# Patient Record
Sex: Female | Born: 1990
Health system: Southern US, Community
[De-identification: ages and names within clinical notes are randomized; demographics above are authoritative.]

## PROBLEM LIST (undated history)

## (undated) DIAGNOSIS — E119 Type 2 diabetes mellitus without complications: Secondary | ICD-10-CM

## (undated) HISTORY — PX: CHOLECYSTECTOMY, LAPAROSCOPIC: SHX56

## (undated) HISTORY — PX: WISDOM TOOTH EXTRACTION: SHX21

---
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
## 2011-02-12 ENCOUNTER — Emergency Department: Payer: Self-pay | Admitting: Emergency Medicine

## 2011-10-02 ENCOUNTER — Ambulatory Visit: Payer: Self-pay

## 2012-01-05 IMAGING — US ULTRASOUND RIGHT BREAST
1 series · 7 of 7 positions shown · non-contrast
Comparison: none

REASON FOR EXAM: [DATE]  smooth firm mobile nodule at [DATE] right breast 2 cm
from the areola
COMMENTS:

[Series 1: ultrasound right breast · 7 of 7 slices shown]
[im 1/7]
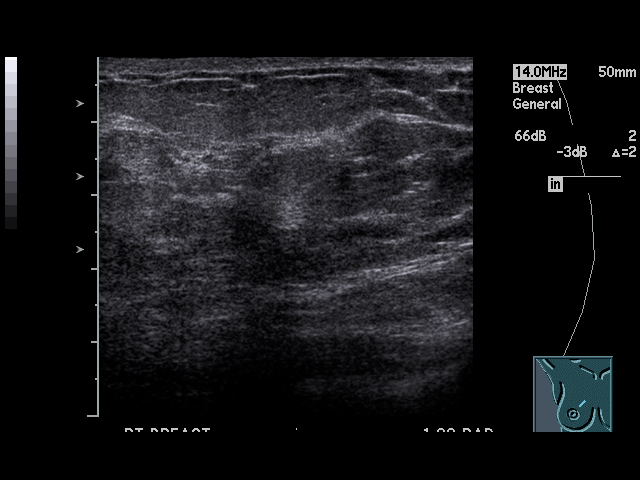
[im 2/7]
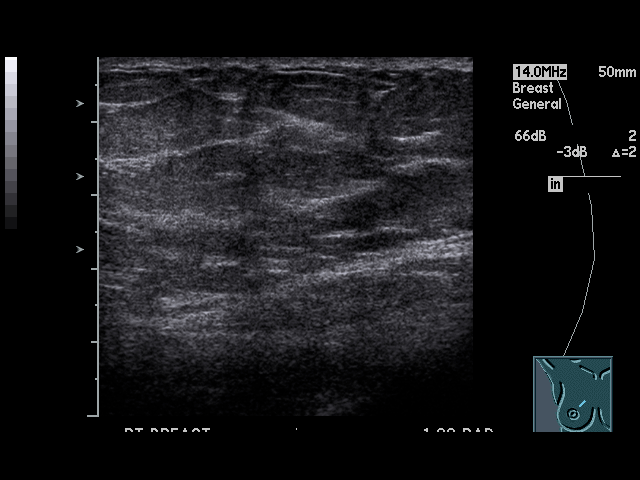
[im 3/7]
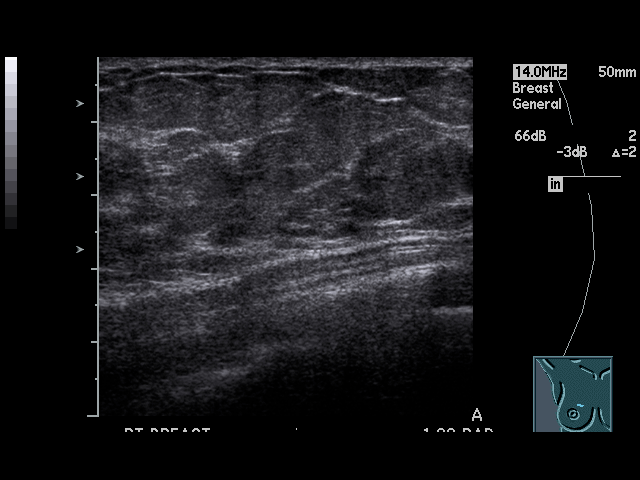
[im 4/7]
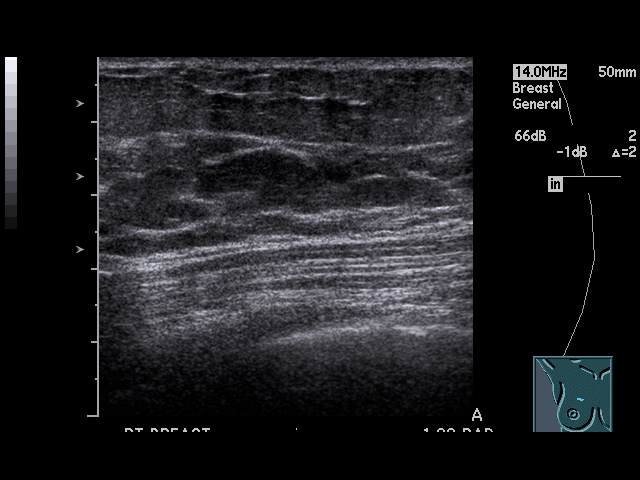
[im 5/7]
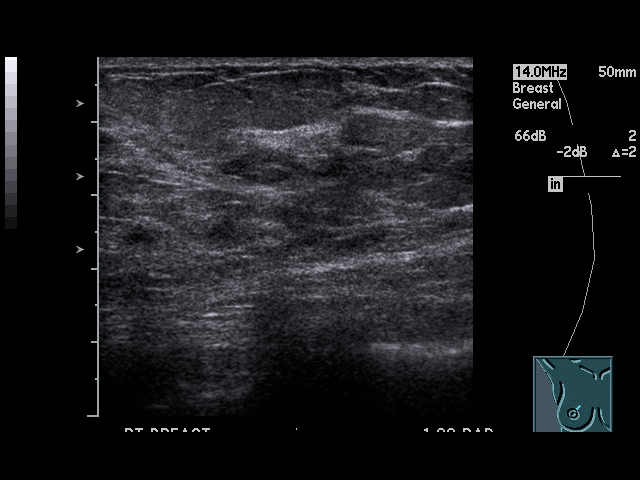
[im 6/7]
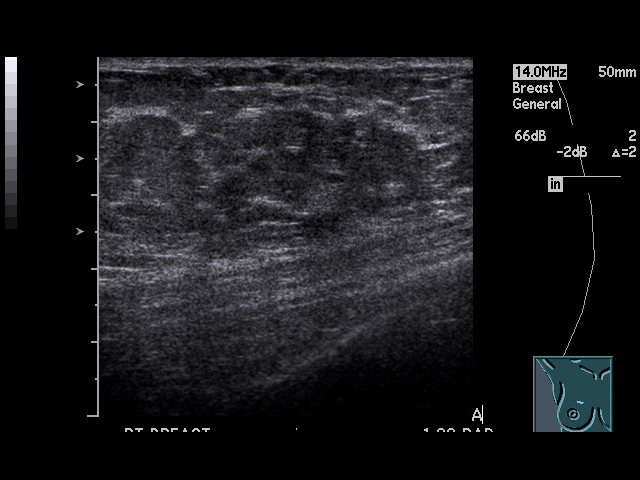
[im 7/7]
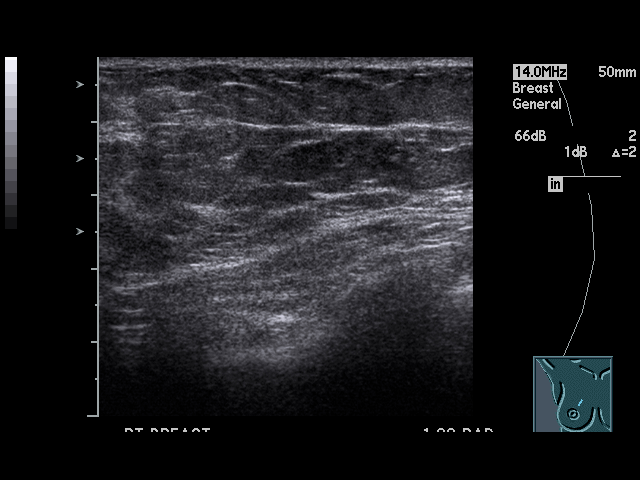

[7 of 7 positions shown; findings below may reference images not displayed]

PROCEDURE:     US  - US RT BREAST ([REDACTED])  - October 02, 2011 [DATE]

RESULT:     The patient has a palpable area of concern in the right breast
at approximately 1 o'clock. Ultrasound examination targeted to this region
shows no solid or cystic mass. No distortion of the breast architecture is
seen. No skin thickening is noted.
IMPRESSION: 1. Benign-appearing targeted right breast ultrasound.
2. Surgical consultation should not be deferred due to the lack of
sonographic abnormalities if a palpable area of concern persists or enlarges.

## 2012-06-15 ENCOUNTER — Emergency Department: Payer: Self-pay | Admitting: *Deleted

## 2012-06-16 LAB — URINALYSIS, COMPLETE
Ketone: NEGATIVE
Leukocyte Esterase: NEGATIVE
Nitrite: NEGATIVE
Ph: 6 (ref 4.5–8.0)
Protein: NEGATIVE
Squamous Epithelial: <1
WBC UR: 1 /HPF (ref 0–5)

## 2012-06-16 LAB — COMPREHENSIVE METABOLIC PANEL
Albumin: 3.9 g/dL (ref 3.4–5.0)
Alkaline Phosphatase: 95 U/L (ref 50–136)
BUN: 10 mg/dL (ref 7–18)
Chloride: 104 mmol/L (ref 98–107)
Co2: 23 mmol/L (ref 21–32)
Creatinine: 0.71 mg/dL (ref 0.60–1.30)
EGFR (African American): >60
EGFR (Non-African Amer.): >60
Glucose: 319 mg/dL — ABNORMAL HIGH (ref 65–99)
Osmolality: 281 (ref 275–301)
SGOT(AST): 31 U/L (ref 15–37)
SGPT (ALT): 41 U/L (ref 12–78)
Total Protein: 7.9 g/dL (ref 6.4–8.2)

## 2012-06-16 LAB — CBC
HGB: 12.7 g/dL (ref 12.0–16.0)
Platelet: 306 10*3/uL (ref 150–440)
RBC: 4.99 10*6/uL (ref 3.80–5.20)
RDW: 16.5 % — ABNORMAL HIGH (ref 11.5–14.5)
WBC: 10.5 10*3/uL (ref 3.6–11.0)

## 2015-08-12 ENCOUNTER — Encounter: Payer: Self-pay | Admitting: Emergency Medicine

## 2015-08-12 ENCOUNTER — Emergency Department
Admission: EM | Admit: 2015-08-12 | Discharge: 2015-08-12 | Disposition: A | Discharge status: Home / Self Care | Payer: BC Managed Care – PPO | Attending: Emergency Medicine | Admitting: Emergency Medicine

## 2015-08-12 DIAGNOSIS — Z794 Long term (current) use of insulin: Secondary | ICD-10-CM | POA: Insufficient documentation

## 2015-08-12 DIAGNOSIS — M5432 Sciatica, left side: Secondary | ICD-10-CM | POA: Insufficient documentation

## 2015-08-12 DIAGNOSIS — Z79899 Other long term (current) drug therapy: Secondary | ICD-10-CM | POA: Insufficient documentation

## 2015-08-12 DIAGNOSIS — E119 Type 2 diabetes mellitus without complications: Secondary | ICD-10-CM | POA: Insufficient documentation

## 2015-08-12 DIAGNOSIS — M79605 Pain in left leg: Secondary | ICD-10-CM | POA: Diagnosis present

## 2015-08-12 HISTORY — DX: Type 2 diabetes mellitus without complications: E11.9

## 2015-08-12 MED ORDER — MELOXICAM 15 MG PO TABS: 15.0000 mg | ORAL_TABLET | Freq: Every day | ORAL | Status: DC

## 2015-08-12 NOTE — ED Notes (Signed)
States she developed pain to left hip area  States pain started about 1 week ago. Unsure of injury but did hit left lower leg ..ambulates with sl limp .

## 2015-08-12 NOTE — ED Provider Notes (Signed)
[facility] Emergency Department Provider Note  ____________________________________________  Time seen: Approximately 7:17 AM  I have reviewed the triage vital signs and the nursing notes.   HISTORY  Chief Complaint Leg Pain    HPI [place] Tessa LernerGarcia Rattigan is a 24 y.o. female who presents to the emergency department complaining of left leg/hip pain times 4 days. She denies any any p injury precipitating this symptom. She states that the pain is described as a sharp/burning sensation. She states that it runs through her buttocks down to the lateral aspect of her left knee. She states that symptoms have increased over the past 4 days. She is not taken medication prior to arrival. She denies any previous incidents of same. She states that the pain is worse with sitting or with "no movement." The pain started as mild and is now moderate.   Past Medical History  Diagnosis Date  . Diabetes mellitus without complication (HCC)     There are no active problems to display for this patient.   History reviewed. No pertinent past surgical history.  Current Outpatient Rx  Name  Route  Sig  Dispense  Refill  . insulin glargine (LANTUS) 100 UNIT/ML injection   Subcutaneous   Inject into the skin at bedtime.         . metFORMIN (GLUCOPHAGE) 500 MG tablet   Oral   Take 500 mg by mouth 2 (two) times daily with a meal.         . meloxicam (MOBIC) 15 MG tablet   Oral   Take 1 tablet (15 mg total) by mouth daily.   30 tablet   0     Allergies Review of patient's allergies indicates no known allergies.  No family history on file.  Social History Social History  Substance Use Topics  . Smoking status: Never Smoker   . Smokeless tobacco: None  . Alcohol Use: No    Review of Systems Constitutional: No fever/chills Eyes: No visual changes. ENT: No sore throat. Cardiovascular: Denies chest pain. Respiratory: Denies shortness of breath. Gastrointestinal:  No abdominal pain.  No nausea, no vomiting.  No diarrhea.  No constipation. Genitourinary: Negative for dysuria. Musculoskeletal: Negative for back pain. Endorses left hip/leg pain Skin: Negative for rash. Neurological: Negative for headaches, focal weakness or numbness.  10-point ROS otherwise negative.  ____________________________________________   PHYSICAL EXAM:  VITAL SIGNS: ED Triage Vitals  Enc Vitals Group     BP --      Pulse --      Resp --      Temp --      Temp src --      SpO2 --      Weight --      Height --      Head Cir --      Peak Flow --      Pain Score --      Pain Loc --      Pain Edu? --      Excl. in GC? --     Constitutional: Alert and oriented. Well appearing and in no acute distress. Eyes: Conjunctivae are normal. PERRL. EOMI. Head: Atraumatic. Nose: No congestion/rhinnorhea. Mouth/Throat: Mucous membranes are moist.  Oropharynx non-erythematous. Neck: No stridor.   Cardiovascular: Normal rate, regular rhythm. Grossly normal heart sounds.  Good peripheral circulation. Respiratory: Normal respiratory effort.  No retractions. Lungs CTAB. Gastrointestinal: Soft and nontender. No distention. No abdominal bruits. No CVA tenderness. Musculoskeletal: No lower extremity tenderness  nor edema.  No joint effusions. Full range of motion to left hip and left knee. No visible deformities. Patient has tenderness to palpation over the sciatic notch no other focal tenderness. No tenderness to palpation over the lumbar spine or paraspinal muscles. Equal sensation and strength bilateral lower extremities. Pulses intact distally. Positive straight leg raise left side. Neurologic:  Normal speech and language. No gross focal neurologic deficits are appreciated. No gait instability. Skin:  Skin is warm, dry and intact. No rash noted. Psychiatric: Mood and affect are normal. Speech and behavior are normal.  ____________________________________________   LABS (all  labs ordered are listed, but only abnormal results are displayed)  Labs Reviewed - No data to display ____________________________________________  EKG   ____________________________________________  RADIOLOGY   ____________________________________________   PROCEDURES  Procedure(s) performed: None  Critical Care performed: No  ____________________________________________   INITIAL IMPRESSION / ASSESSMENT AND PLAN / ED COURSE  Pertinent labs & imaging results that were available during my care of the patient were reviewed by me and considered in my medical decision making (see chart for details).  Patient's history, symptoms, physical exam are consistent with a diagnosis of sciatica. I advised the patient of findings and diagnosis and she verbalizes understanding of same. I'll place the patient on meloxicam for symptomatic relief and advised the patient of this. She verbalizes understanding. The patient is instructed not to take any anti-inflammatories other than the prescribed medication. She verbalizes understanding and compliance treatment plan. ____________________________________________   FINAL CLINICAL IMPRESSION(S) / ED DIAGNOSES  Final diagnoses:  Sciatica of left side      Racheal Patches, PA-C 08/12/15 0731  Loleta Rose, MD 08/12/15 1416

## 2015-08-12 NOTE — Discharge Instructions (Signed)
Radicular Pain °Radicular pain in either the arm or leg is usually from a bulging or herniated disk in the spine. A piece of the herniated disk may press against the nerves as the nerves exit the spine. This causes pain which is felt at the tips of the nerves down the arm or leg. Other causes of radicular pain may include: °· Fractures. °· Heart disease. °· Cancer. °· An abnormal and usually degenerative state of the nervous system or nerves (neuropathy). °Diagnosis may require CT or MRI scanning to determine the primary cause.  °Nerves that start at the neck (nerve roots) may cause radicular pain in the outer shoulder and arm. It can spread down to the thumb and fingers. The symptoms vary depending on which nerve root has been affected. In most cases radicular pain improves with conservative treatment. Neck problems may require physical therapy, a neck collar, or cervical traction. Treatment may take many weeks, and surgery may be considered if the symptoms do not improve.  °Conservative treatment is also recommended for sciatica. Sciatica causes pain to radiate from the lower back or buttock area down the leg into the foot. Often there is a history of back problems. Most patients with sciatica are better after 2 to 4 weeks of rest and other supportive care. Short term bed rest can reduce the disk pressure considerably. Sitting, however, is not a good position since this increases the pressure on the disk. You should avoid bending, lifting, and all other activities which make the problem worse. Traction can be used in severe cases. Surgery is usually reserved for patients who do not improve within the first months of treatment. °Only take over-the-counter or prescription medicines for pain, discomfort, or fever as directed by your caregiver. Narcotics and muscle relaxants may help by relieving more severe pain and spasm and by providing mild sedation. Cold or massage can give significant relief. Spinal manipulation  is not recommended. It can increase the degree of disc protrusion. Epidural steroid injections are often effective treatment for radicular pain. These injections deliver medicine to the spinal nerve in the space between the protective covering of the spinal cord and back bones (vertebrae). Your caregiver can give you more information about steroid injections. These injections are most effective when given within two weeks of the onset of pain.  °You should see your caregiver for follow up care as recommended. A program for neck and back injury rehabilitation with stretching and strengthening exercises is an important part of management.  °SEEK IMMEDIATE MEDICAL CARE IF: °· You develop increased pain, weakness, or numbness in your arm or leg. °· You develop difficulty with bladder or bowel control. °· You develop abdominal pain. °  °This information is not intended to replace advice given to you by your health care provider. Make sure you discuss any questions you have with your health care provider. °  °Document Released: 11/07/2004 Document Revised: 10/21/2014 Document Reviewed: 04/26/2015 °Elsevier Interactive Patient Education ©2016 [employer] ° °Sciatica °Sciatica is pain, weakness, numbness, or tingling along the path of the sciatic nerve. The nerve starts in the lower back and runs down the back of each leg. The nerve controls the muscles in the lower leg and in the back of the knee, while also providing sensation to the back of the thigh, lower leg, and the sole of your foot. Sciatica is a symptom of another medical condition. For instance, nerve damage or certain conditions, such as a herniated disk or bone spur on the   spine, pinch or put pressure on the sciatic nerve. This causes the pain, weakness, or other sensations normally associated with sciatica. Generally, sciatica only affects one side of the body. °CAUSES  °· Herniated or slipped disc. °· Degenerative disk disease. °· A pain disorder involving  the narrow muscle in the buttocks (piriformis syndrome). °· Pelvic injury or fracture. °· Pregnancy. °· Tumor (rare). °SYMPTOMS  °Symptoms can vary from mild to very severe. The symptoms usually travel from the low back to the buttocks and down the back of the leg. Symptoms can include: °· Mild tingling or dull aches in the lower back, leg, or hip. °· Numbness in the back of the calf or sole of the foot. °· Burning sensations in the lower back, leg, or hip. °· Sharp pains in the lower back, leg, or hip. °· Leg weakness. °· Severe back pain inhibiting movement. °These symptoms may get worse with coughing, sneezing, laughing, or prolonged sitting or standing. Also, being overweight may worsen symptoms. °DIAGNOSIS  °Your caregiver will perform a physical exam to look for common symptoms of sciatica. He or she may ask you to do certain movements or activities that would trigger sciatic nerve pain. Other tests may be performed to find the cause of the sciatica. These may include: °· Blood tests. °· X-rays. °· Imaging tests, such as an MRI or CT scan. °TREATMENT  °Treatment is directed at the cause of the sciatic pain. Sometimes, treatment is not necessary and the pain and discomfort goes away on its own. If treatment is needed, your caregiver may suggest: °· Over-the-counter medicines to relieve pain. °· Prescription medicines, such as anti-inflammatory medicine, muscle relaxants, or narcotics. °· Applying heat or ice to the painful area. °· Steroid injections to lessen pain, irritation, and inflammation around the nerve. °· Reducing activity during periods of pain. °· Exercising and stretching to strengthen your abdomen and improve flexibility of your spine. Your caregiver may suggest losing weight if the extra weight makes the back pain worse. °· Physical therapy. °· Surgery to eliminate what is pressing or pinching the nerve, such as a bone spur or part of a herniated disk. °HOME CARE INSTRUCTIONS  °· Only take  over-the-counter or prescription medicines for pain or discomfort as directed by your caregiver. °· Apply ice to the affected area for 20 minutes, 3-4 times a day for the first 48-72 hours. Then try heat in the same way. °· Exercise, stretch, or perform your usual activities if these do not aggravate your pain. °· Attend physical therapy sessions as directed by your caregiver. °· Keep all follow-up appointments as directed by your caregiver. °· Do not wear high heels or shoes that do not provide proper support. °· Check your mattress to see if it is too soft. A firm mattress may lessen your pain and discomfort. °SEEK IMMEDIATE MEDICAL CARE IF:  °· You lose control of your bowel or bladder (incontinence). °· You have increasing weakness in the lower back, pelvis, buttocks, or legs. °· You have redness or swelling of your back. °· You have a burning sensation when you urinate. °· You have pain that gets worse when you lie down or awakens you at night. °· Your pain is worse than you have experienced in the past. °· Your pain is lasting longer than 4 weeks. °· You are suddenly losing weight without reason. °MAKE SURE YOU: °· Understand these instructions. °· Will watch your condition. °· Will get help right away if you are not doing   well or get worse. °  °This information is not intended to replace advice given to you by your health care provider. Make sure you discuss any questions you have with your health care provider. °  °Document Released: 09/24/2001 Document Revised: 06/21/2015 Document Reviewed: 02/09/2012 °Elsevier Interactive Patient Education ©2016 [employer] ° °

## 2015-08-12 NOTE — ED Notes (Signed)
Presents with pain to left hip area for about 1 week

## 2018-07-13 ENCOUNTER — Other Ambulatory Visit: Payer: Self-pay | Admitting: Physician Assistant

## 2018-07-13 DIAGNOSIS — R1011 Right upper quadrant pain: Secondary | ICD-10-CM

## 2018-07-17 ENCOUNTER — Ambulatory Visit: Payer: BC Managed Care – PPO

## 2019-02-15 ENCOUNTER — Other Ambulatory Visit: Payer: Self-pay

## 2019-02-15 ENCOUNTER — Encounter: Payer: Self-pay | Admitting: Emergency Medicine

## 2019-02-15 DIAGNOSIS — E119 Type 2 diabetes mellitus without complications: Secondary | ICD-10-CM | POA: Insufficient documentation

## 2019-02-15 DIAGNOSIS — O9989 Other specified diseases and conditions complicating pregnancy, childbirth and the puerperium: Secondary | ICD-10-CM | POA: Insufficient documentation

## 2019-02-15 DIAGNOSIS — O210 Mild hyperemesis gravidarum: Secondary | ICD-10-CM | POA: Diagnosis present

## 2019-02-15 DIAGNOSIS — Z3A01 Less than 8 weeks gestation of pregnancy: Secondary | ICD-10-CM | POA: Insufficient documentation

## 2019-02-15 DIAGNOSIS — O99619 Diseases of the digestive system complicating pregnancy, unspecified trimester: Secondary | ICD-10-CM | POA: Insufficient documentation

## 2019-02-15 DIAGNOSIS — K8020 Calculus of gallbladder without cholecystitis without obstruction: Secondary | ICD-10-CM | POA: Insufficient documentation

## 2019-02-15 DIAGNOSIS — R1011 Right upper quadrant pain: Secondary | ICD-10-CM | POA: Diagnosis not present

## 2019-02-15 DIAGNOSIS — Z794 Long term (current) use of insulin: Secondary | ICD-10-CM | POA: Insufficient documentation

## 2019-02-15 DIAGNOSIS — O24111 Pre-existing diabetes mellitus, type 2, in pregnancy, first trimester: Secondary | ICD-10-CM | POA: Diagnosis not present

## 2019-02-15 NOTE — ED Triage Notes (Addendum)
Patient ambulatory to triage with steady gait, without difficulty or distress noted, mask in place; pt reports upper abd pain radiating around into back since yesterday, worse tonight accomp by N/V; pt reports being tested at work today (for loss of taste & smell) for COVID, results pending--denies cough/fever/congestion/SHOB

## 2019-02-16 ENCOUNTER — Emergency Department: Payer: BC Managed Care – PPO

## 2019-02-16 ENCOUNTER — Emergency Department
Admission: EM | Admit: 2019-02-16 | Discharge: 2019-02-16 | Disposition: A | Discharge status: Home / Self Care | Payer: BC Managed Care – PPO | Attending: Emergency Medicine | Admitting: Emergency Medicine

## 2019-02-16 DIAGNOSIS — O210 Mild hyperemesis gravidarum: Secondary | ICD-10-CM

## 2019-02-16 DIAGNOSIS — R1011 Right upper quadrant pain: Secondary | ICD-10-CM

## 2019-02-16 DIAGNOSIS — K8020 Calculus of gallbladder without cholecystitis without obstruction: Secondary | ICD-10-CM

## 2019-02-16 LAB — URINALYSIS, COMPLETE (UACMP) WITH MICROSCOPIC
Bacteria, UA: NONE SEEN
Bilirubin Urine: NEGATIVE
Glucose, UA: >=500 mg/dL — AB
Hgb urine dipstick: NEGATIVE
Ketones, ur: 80 mg/dL — AB
Leukocytes,Ua: NEGATIVE
Nitrite: POSITIVE — AB
Protein, ur: 30 mg/dL — AB
Specific Gravity, Urine: 1.039 — ABNORMAL HIGH (ref 1.005–1.030)
pH: 6 (ref 5.0–8.0)

## 2019-02-16 LAB — COMPREHENSIVE METABOLIC PANEL
ALT: 30 U/L (ref 0–44)
AST: 29 U/L (ref 15–41)
Albumin: 4.1 g/dL (ref 3.5–5.0)
Alkaline Phosphatase: 64 U/L (ref 38–126)
Anion gap: 13 (ref 5–15)
BUN: 6 mg/dL (ref 6–20)
CO2: 20 mmol/L — ABNORMAL LOW (ref 22–32)
Calcium: 8.6 mg/dL — ABNORMAL LOW (ref 8.9–10.3)
Chloride: 101 mmol/L (ref 98–111)
Creatinine, Ser: 0.47 mg/dL (ref 0.44–1.00)
GFR calc Af Amer: >60 mL/min (ref >60)
GFR calc non Af Amer: >60 mL/min (ref >60)
Glucose, Bld: 252 mg/dL — ABNORMAL HIGH (ref 70–99)
Potassium: 3.4 mmol/L — ABNORMAL LOW (ref 3.5–5.1)
Sodium: 134 mmol/L — ABNORMAL LOW (ref 135–145)
Total Bilirubin: 0.5 mg/dL (ref 0.3–1.2)
Total Protein: 7.9 g/dL (ref 6.5–8.1)

## 2019-02-16 LAB — CBC WITH DIFFERENTIAL/PLATELET
Abs Immature Granulocytes: 0.03 10*3/uL (ref 0.00–0.07)
Basophils Absolute: 0 10*3/uL (ref 0.0–0.1)
Basophils Relative: 1 %
Eosinophils Absolute: 0.1 10*3/uL (ref 0.0–0.5)
Eosinophils Relative: 1 %
HCT: 43.8 % (ref 36.0–46.0)
Hemoglobin: 14 g/dL (ref 12.0–15.0)
Immature Granulocytes: 0 %
Lymphocytes Relative: 34 %
Lymphs Abs: 2.6 10*3/uL (ref 0.7–4.0)
MCH: 25.3 pg — ABNORMAL LOW (ref 26.0–34.0)
MCHC: 32 g/dL (ref 30.0–36.0)
MCV: 79.2 fL — ABNORMAL LOW (ref 80.0–100.0)
Monocytes Absolute: 0.4 10*3/uL (ref 0.1–1.0)
Monocytes Relative: 5 %
Neutro Abs: 4.5 10*3/uL (ref 1.7–7.7)
Neutrophils Relative %: 59 %
Platelets: 296 10*3/uL (ref 150–400)
RBC: 5.53 MIL/uL — ABNORMAL HIGH (ref 3.87–5.11)
RDW: 14.5 % (ref 11.5–15.5)
WBC: 7.6 10*3/uL (ref 4.0–10.5)
nRBC: 0 % (ref 0.0–0.2)

## 2019-02-16 LAB — LIPASE, BLOOD: Lipase: 21 U/L (ref 11–51)

## 2019-02-16 LAB — HCG, QUANTITATIVE, PREGNANCY: hCG, Beta Chain, Quant, S: 6961 m[IU]/mL — ABNORMAL HIGH (ref <5)

## 2019-02-16 MED ORDER — SODIUM CHLORIDE 0.9 % IV BOLUS
1000.0000 mL | Freq: Once | INTRAVENOUS | Status: AC
  Administered 2019-02-16: 1000 mL via INTRAVENOUS

## 2019-02-16 MED ORDER — METOCLOPRAMIDE HCL 5 MG/ML IJ SOLN
10.0000 mg | Freq: Once | INTRAMUSCULAR | Status: AC
  Administered 2019-02-16: 03:00:00 10 mg via INTRAVENOUS

## 2019-02-16 MED ORDER — SODIUM CHLORIDE 0.9 % IV BOLUS
1000.0000 mL | Freq: Once | INTRAVENOUS | Status: AC
  Administered 2019-02-16: 03:00:00 1000 mL via INTRAVENOUS

## 2019-02-16 MED ORDER — METOCLOPRAMIDE HCL 10 MG PO TABS: 10.0000 mg | ORAL_TABLET | Freq: Three times a day (TID) | ORAL | 0 refills | Status: DC | PRN

## 2019-02-16 NOTE — ED Provider Notes (Signed)
[facility] Emergency Department Provider Note    First MD Initiated Contact with Patient 02/16/19 [phone]     (approximate)  I have reviewed the triage vital signs and the nursing notes.   HISTORY  Chief Complaint Abdominal Pain    HPI Ellen Jensen is a 28 y.o. female G3 P2 approximately [redacted] weeks pregnant presents to the emergency department with upper abdominal pain which patient states radiates to her back predominantly on the right side.  Patient denies any vaginal bleeding.  Patient denies any fever no diarrhea or constipation.        Past Medical History:  Diagnosis Date  . Diabetes mellitus without complication (HCC)     There are no active problems to display for this patient.   History reviewed. No pertinent surgical history.  Prior to Admission medications   Medication Sig Start Date End Date Taking? Authorizing Provider  insulin glargine (LANTUS) 100 UNIT/ML injection Inject into the skin at bedtime.    [provider]  meloxicam (MOBIC) 15 MG tablet Take 1 tablet (15 mg total) by mouth daily. 08/12/15   Cuthriell, Delorise Royals, PA-C  metFORMIN (GLUCOPHAGE) 500 MG tablet Take 500 mg by mouth 2 (two) times daily with a meal.    [provider]  metoCLOPramide (REGLAN) 10 MG tablet Take 1 tablet (10 mg total) by mouth every 8 (eight) hours as needed for nausea or vomiting. 02/16/19 02/16/20  Darci Current, MD    Allergies Patient has no known allergies.  No family history on file.  Social History Social History   Tobacco Use  . Smoking status: Never Smoker  . Smokeless tobacco: Never Used  Substance Use Topics  . Alcohol use: No  . Drug use: Not on file    Review of Systems Constitutional: No fever/chills Eyes: No visual changes. ENT: No sore throat. Cardiovascular: Denies chest pain. Respiratory: Denies shortness of breath. Gastrointestinal: Positive for abdominal pain and vomiting Genitourinary:  Negative for dysuria. Musculoskeletal: Negative for neck pain.  Negative for back pain. Integumentary: Negative for rash. Neurological: Negative for headaches, focal weakness or numbness.   ____________________________________________   PHYSICAL EXAM:  VITAL SIGNS: ED Triage Vitals  Enc Vitals Group     BP 02/15/19 2346 128/90     Pulse Rate 02/15/19 2346 (!) 101     Resp 02/15/19 2346 20     Temp 02/15/19 2346 98.4 F (36.9 C)     Temp Source 02/15/19 2346 Oral     SpO2 02/15/19 2346 99 %     Weight 02/15/19 2343 115.7 kg (255 lb)     Height 02/15/19 2343 1.702 m (5\' 7" )     Head Circumference --      Peak Flow --      Pain Score 02/15/19 2343 7     Pain Loc --      Pain Edu? --      Excl. in GC? --     Constitutional: Alert and oriented.  Actively vomiting Eyes: Conjunctivae are normal. Mouth/Throat: Mucous membranes are moist.  Oropharynx non-erythematous. Neck: No stridor.   Cardiovascular: Normal rate, regular rhythm. Good peripheral circulation. Grossly normal heart sounds. Respiratory: Normal respiratory effort.  No retractions. No audible wheezing. Gastrointestinal: Right upper quadrant tenderness to palpation.. No distention.  Musculoskeletal: No lower extremity tenderness nor edema. No gross deformities of extremities. Neurologic:  Normal speech and language. No gross focal neurologic deficits are appreciated.  Skin:  Skin is warm, dry  and intact. No rash noted. Psychiatric: Mood and affect are normal. Speech and behavior are normal.  ____________________________________________   LABS (all labs ordered are listed, but only abnormal results are displayed)  Labs Reviewed  CBC WITH DIFFERENTIAL/PLATELET - Abnormal; Notable for the following components:      Result Value   RBC 5.53 (*)    MCV 79.2 (*)    MCH 25.3 (*)    All other components within normal limits  COMPREHENSIVE METABOLIC PANEL - Abnormal; Notable for the following components:   Sodium 134  (*)    Potassium 3.4 (*)    CO2 20 (*)    Glucose, Bld 252 (*)    Calcium 8.6 (*)    All other components within normal limits  URINALYSIS, COMPLETE (UACMP) WITH MICROSCOPIC - Abnormal; Notable for the following components:   Color, Urine YELLOW (*)    APPearance CLEAR (*)    Specific Gravity, Urine 1.039 (*)    Glucose, UA >=500 (*)    Ketones, ur 80 (*)    Protein, ur 30 (*)    Nitrite POSITIVE (*)    All other components within normal limits  HCG, QUANTITATIVE, PREGNANCY - Abnormal; Notable for the following components:   hCG, Beta Chain, Quant, S 6,961 (*)    All other components within normal limits  LIPASE, BLOOD   ___________________________________  RADIOLOGY I, [place] N BROWN, personally viewed and evaluated these images (plain radiographs) as part of my medical decision making, as well as reviewing the written report by the radiologist.  ED MD interpretation: OB ultrasound revealed intrauterine gestation sac measuring approximately 5 weeks 3 days without any unexpected findings per radiologist.  Right upper quadrant ultrasound revealed cholelithiasis without any evidence of cholecystitis.  Official radiology report(s): [place] Ob Comp Less 14 Wks  Result Date: 02/16/2019 CLINICAL DATA:  Pelvic pain and first-trimester pregnancy EXAM: OBSTETRIC <14 WK US AND TRANSVAGINAL OB US TECHNIQUE: Both transabdominal and transvaginal ultrasound examinations were performed for complete evaluation of the gestation as well as the maternal uterus, adnexal regions, and pelvic cul-de-sac. Transvaginal technique was performed to assess early pregnancy. COMPARISON:  None. FINDINGS: Intrauterine gestational sac: Single Yolk sac:  Present Embryo: An early embryo is likely visible given signet ring appearance. Cardiac Activity: Not detectable at this size. MSD: 7.4 mm   5 w   3 d Subchorionic hemorrhage:  None visualized. Maternal uterus/adnexae: Corpus luteum on the left. IMPRESSION: 1. Intrauterine  gestation measuring 5 weeks 3 days by mean sac diameter. A yolk sac is present. 2. No unexpected finding. Electronically Signed   By: Marnee SpringJonathon  Watts M.D.   On: 02/16/2019 05:00   [place] Ob Transvaginal  Result Date: 02/16/2019 CLINICAL DATA:  Pelvic pain and first-trimester pregnancy EXAM: OBSTETRIC <14 WK US AND TRANSVAGINAL OB US TECHNIQUE: Both transabdominal and transvaginal ultrasound examinations were performed for complete evaluation of the gestation as well as the maternal uterus, adnexal regions, and pelvic cul-de-sac. Transvaginal technique was performed to assess early pregnancy. COMPARISON:  None. FINDINGS: Intrauterine gestational sac: Single Yolk sac:  Present Embryo: An early embryo is likely visible given signet ring appearance. Cardiac Activity: Not detectable at this size. MSD: 7.4 mm   5 w   3 d Subchorionic hemorrhage:  None visualized. Maternal uterus/adnexae: Corpus luteum on the left. IMPRESSION: 1. Intrauterine gestation measuring 5 weeks 3 days by mean sac diameter. A yolk sac is present. 2. No unexpected finding. Electronically Signed   By: Kathrynn DuckingJonathon  Watts M.D.  On: 02/16/2019 05:00   US Abdomen Limited Ruq  Result Date: 02/16/2019 CLINICAL DATA:  Right upper quadrant pain since yesterday, worse tonight EXAM: ULTRASOUND ABDOMEN LIMITED RIGHT UPPER QUADRANT COMPARISON:  08/04/2009 FINDINGS: Gallbladder: Mobile shadowing gallstone measuring 8 mm. Gallbladder is full but there is no wall thickening or focal tenderness. Common bile duct: Diameter: 8 mm Liver: Echogenic liver with diminished acoustic penetration. No focal lesion is seen. Portal vein is patent on color Doppler imaging with normal direction of blood flow towards the liver. IMPRESSION: 1. Cholelithiasis without evidence of acute cholecystitis. 2. Hepatic steatosis. Electronically Signed   By: Marnee Spring M.D.   On: 02/16/2019 04:51    ____________________________________________   PROCEDURES   Procedure(s)  performed (including Critical Care):  Procedures   ____________________________________________   INITIAL IMPRESSION / MDM / ASSESSMENT AND PLAN / ED COURSE  As part of my medical decision making, I reviewed the following data within the electronic MEDICAL RECORD NUMBER   28 year old female presenting with above-stated history and physical exam concerning for hyperemesis gravidarum as well as cholelithiasis.  Ultrasound revealed cholelithiasis and a 5-week 3-day intrauterine gestational sac.  Laboratory data notable for ketones of 80 in the urine.  Patient received 2 L IV normal saline and 10 mg of Reglan IV with resolution of nausea and vomiting.  Patient informed of all clinical findings     *Ellen Jensen was evaluated in Emergency Department on 02/16/2019 for the symptoms described in the history of present illness. She was evaluated in the context of the global COVID-19 pandemic, which necessitated consideration that the patient might be at risk for infection with the SARS-CoV-2 virus that causes COVID-19. Institutional protocols and algorithms that pertain to the evaluation of patients at risk for COVID-19 are in a state of rapid change based on information released by regulatory bodies including the CDC and federal and state organizations. These policies and algorithms were followed during the patient's care in the ED.*    ____________________________________________  FINAL CLINICAL IMPRESSION(S) / ED DIAGNOSES  Final diagnoses:  RUQ pain  Hyperemesis gravidarum  Gallstones     MEDICATIONS GIVEN DURING THIS VISIT:  Medications  sodium chloride 0.9 % bolus 1,000 mL (0 mLs Intravenous Stopped 02/16/19 0501)  sodium chloride 0.9 % bolus 1,000 mL (0 mLs Intravenous Stopped 02/16/19 0501)  metoCLOPramide (REGLAN) injection 10 mg (10 mg Intravenous Given 02/16/19 0301)     ED Discharge Orders         Ordered    metoCLOPramide (REGLAN) 10 MG tablet  Every 8 hours PRN     02/16/19  0510           Note:  This document was prepared using Dragon voice recognition software and may include unintentional dictation errors.   Darci Current, MD 02/16/19 [phone]

## 2020-06-10 IMAGING — US ULTRASOUND ABDOMEN LIMITED
1 series · 14 of 25 positions shown · non-contrast
Comparison: 08/04/2009

CLINICAL DATA: Right upper quadrant pain since yesterday, worse
tonight

EXAM:
ULTRASOUND ABDOMEN LIMITED RIGHT UPPER QUADRANT

[Series 1: ultrasound abdomen limited · 0.24mm/px · 14 of 41 slices shown]
[im 1/41]
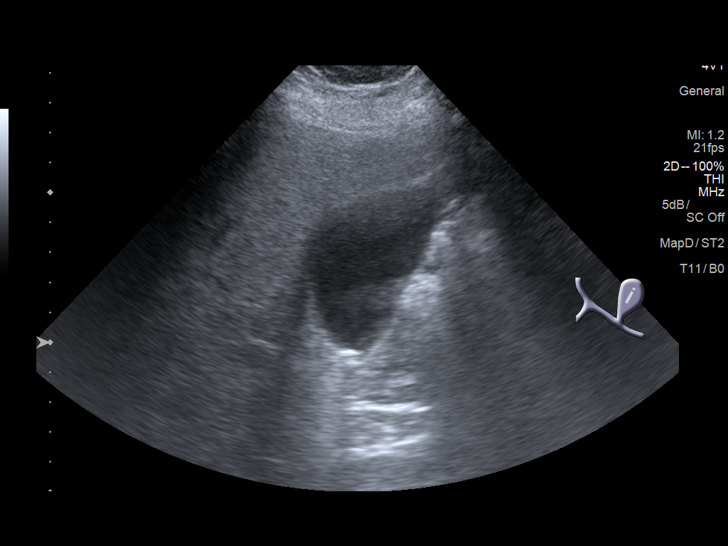
[im 4/41]
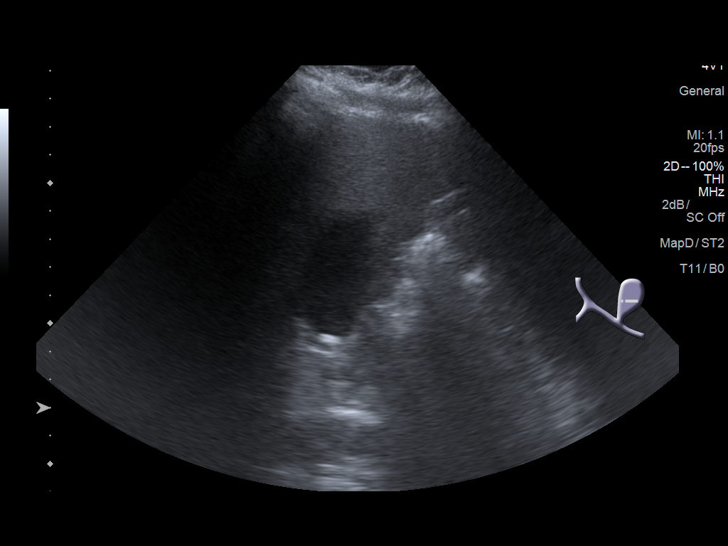
[im 7/41]
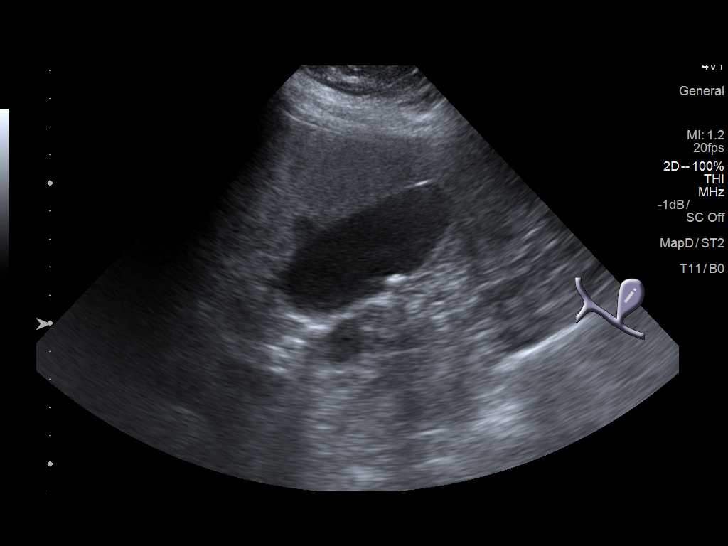
[im 11/41]
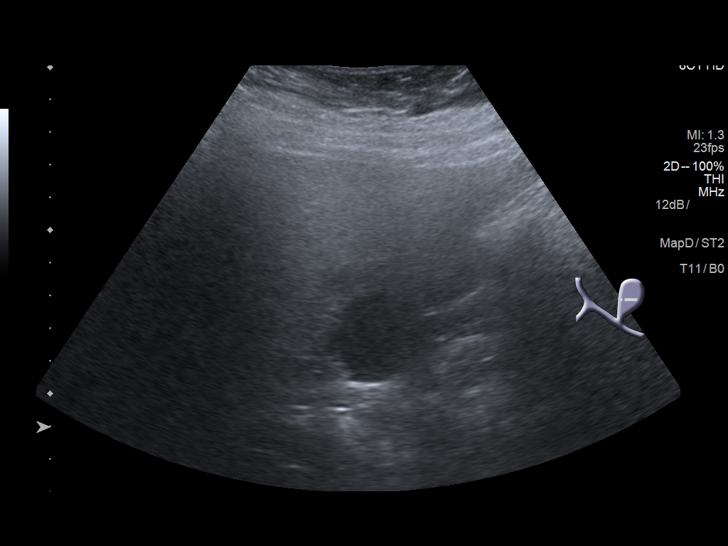
[im 14/41]
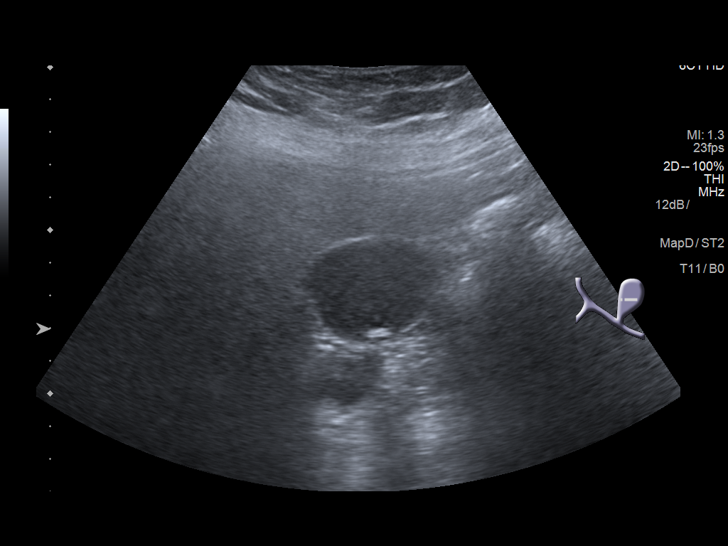
[im 16/41]
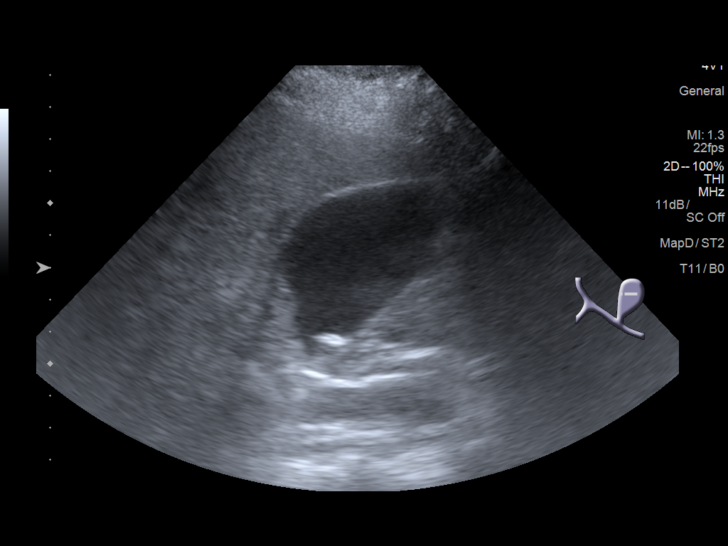
[im 19/41]
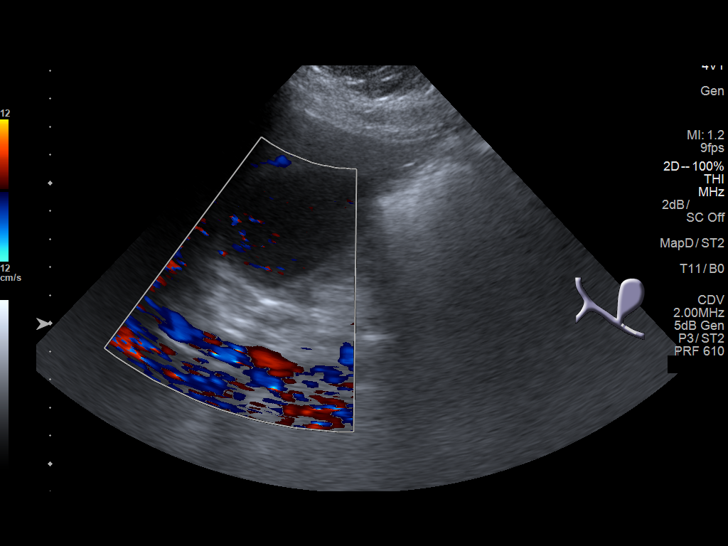
[im 22/41]
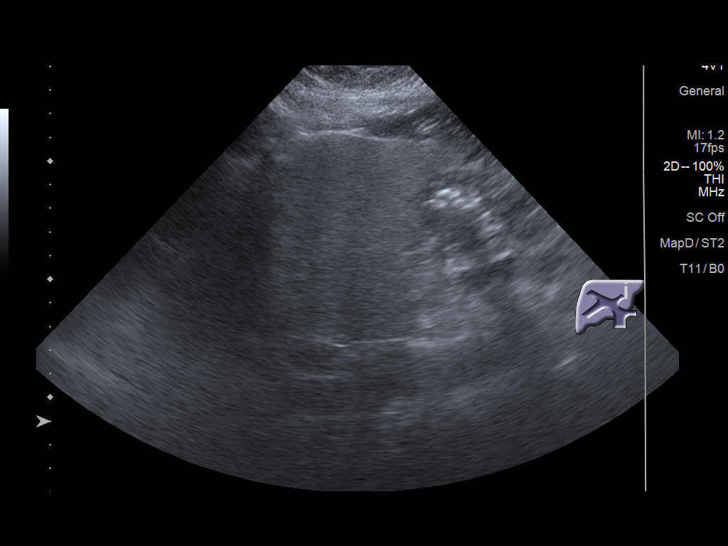
[im 26/41]
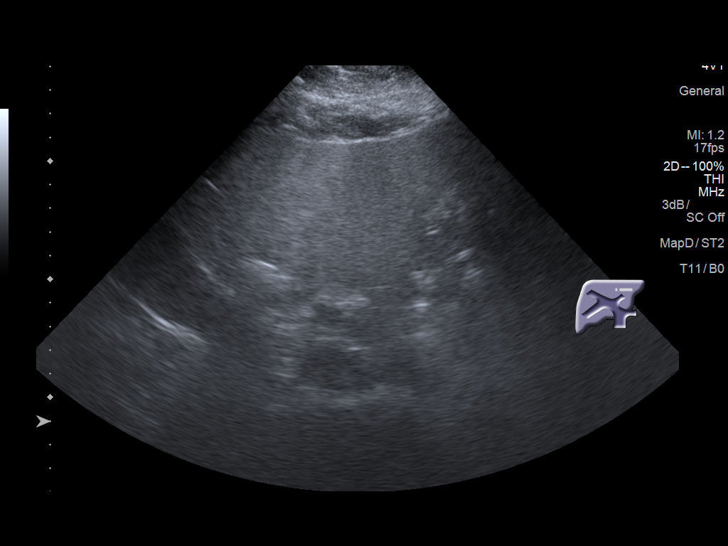
[im 27/41]
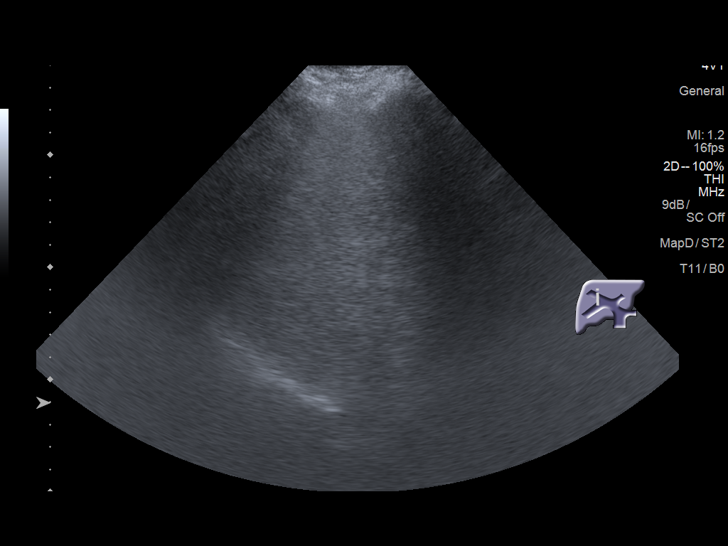
[im 31/41]
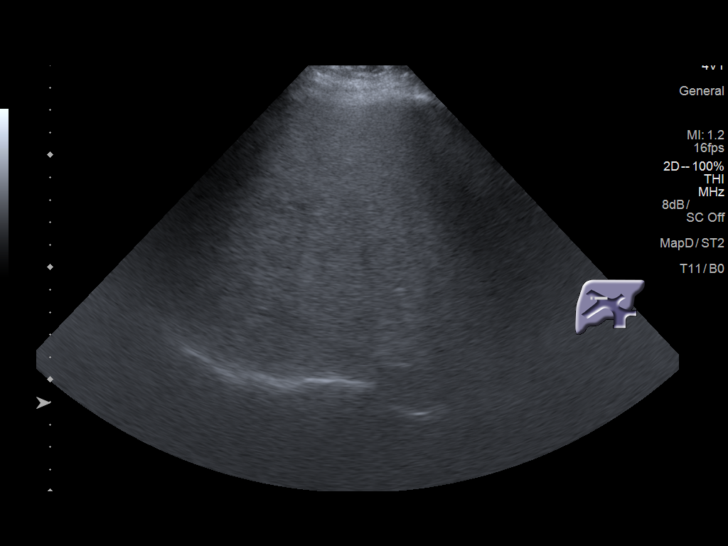
[im 34/41]
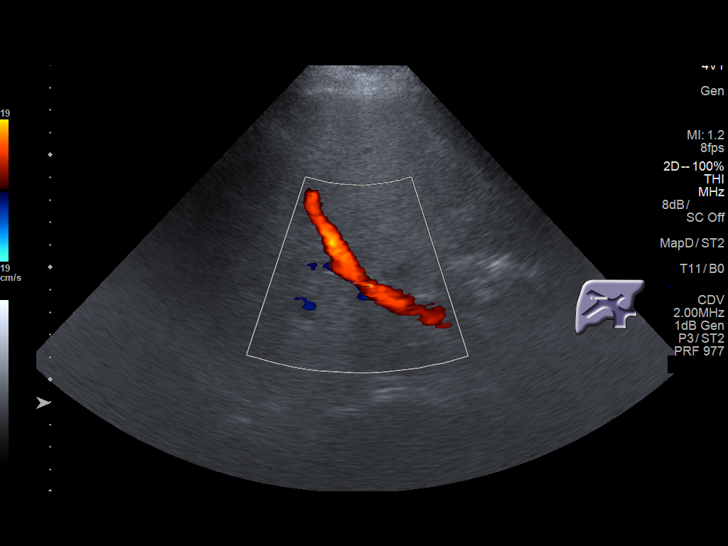
[im 37/41]
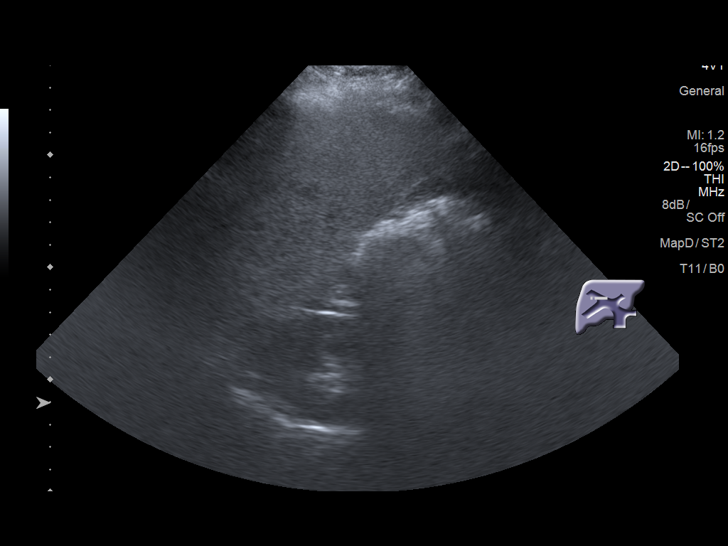
[im 41/41]
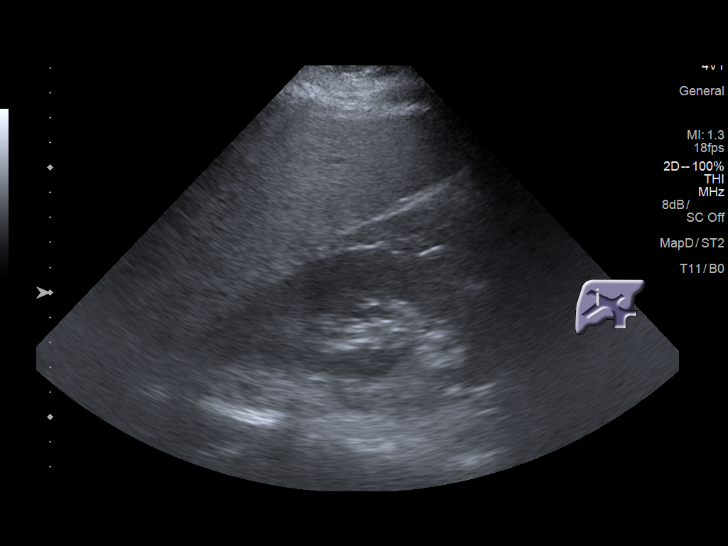

[14 of 25 positions shown; findings below may reference images not displayed]

FINDINGS: Gallbladder:

Mobile shadowing gallstone measuring 8 mm. Gallbladder is full but
there is no wall thickening or focal tenderness.

Common bile duct:

Diameter: 8 mm

Liver:

Echogenic liver with diminished acoustic penetration. No focal
lesion is seen. Portal vein is patent on color Doppler imaging with
normal direction of blood flow towards the liver.
IMPRESSION: 1. Cholelithiasis without evidence of acute cholecystitis.
2. Hepatic steatosis.

## 2020-08-13 ENCOUNTER — Encounter: Payer: Self-pay | Admitting: Emergency Medicine

## 2020-08-13 ENCOUNTER — Ambulatory Visit
Admission: EM | Admit: 2020-08-13 | Discharge: 2020-08-13 | Disposition: A | Discharge status: Home / Self Care | Payer: Managed Care, Other (non HMO) | Attending: Physician Assistant | Admitting: Physician Assistant

## 2020-08-13 ENCOUNTER — Other Ambulatory Visit: Payer: Self-pay

## 2020-08-13 DIAGNOSIS — N760 Acute vaginitis: Secondary | ICD-10-CM | POA: Diagnosis not present

## 2020-08-13 DIAGNOSIS — B372 Candidiasis of skin and nail: Secondary | ICD-10-CM | POA: Diagnosis not present

## 2020-08-13 DIAGNOSIS — S3141XA Laceration without foreign body of vagina and vulva, initial encounter: Secondary | ICD-10-CM

## 2020-08-13 LAB — WET PREP, GENITAL
Sperm: NONE SEEN
Trich, Wet Prep: NONE SEEN
WBC, Wet Prep HPF POC: NONE SEEN

## 2020-08-13 MED ORDER — FLUCONAZOLE 150 MG PO TABS: ORAL_TABLET | ORAL | 1 refills | Status: DC

## 2020-08-13 MED ORDER — METRONIDAZOLE 500 MG PO TABS: 500.0000 mg | ORAL_TABLET | Freq: Two times a day (BID) | ORAL | 0 refills | Status: AC

## 2020-08-13 MED ORDER — CLOTRIMAZOLE 1 % EX CREA: TOPICAL_CREAM | CUTANEOUS | 1 refills | Status: DC

## 2020-08-13 NOTE — ED Provider Notes (Signed)
[facility]    CSN: [id number] Arrival date & time: 08/13/20  1235      History   Chief Complaint Chief Complaint  Patient presents with  . Vaginal Discharge  . Pelvic Pain    HPI Ellen Jensen is a 29 y.o. female presenting for about a 2-week history of vaginal itching and discharge.  She states that she has been treated for a yeast infection recently and finished her second pill of Diflucan 2 days ago.  She states that she has been having vaginal pain and has 2 small cuts of the vulva.  She is currently on her menstrual cycle and does admit that she is probably bleeding heavily.  Patient is diabetic and admits that her diabetes has not been well controlled over the past couple of months and admits that her diabetes has not been controlled well over the past couple of months and admits that her diabetes has not been well controlled over the past couple of months.  She does take Metformin and use insulin sliding scale with meals.  Patient denies concern for STIs.  She denies any fever, dysuria, nausea, vomiting, back pain or abdominal pain.  No other complaints or concerns today.  HPI  Past Medical History:  Diagnosis Date  . Diabetes mellitus without complication (HCC)     There are no problems to display for this patient.   History reviewed. No pertinent surgical history.  OB History   No obstetric history on file.      Home Medications    Prior to Admission medications   Medication Sig Start Date End Date Taking? Authorizing Provider  insulin detemir (LEVEMIR) 100 UNIT/ML FlexPen Inject into the skin. 02/16/17  Yes [provider]  insulin glargine (LANTUS) 100 UNIT/ML injection Inject into the skin at bedtime.   Yes [provider]  metFORMIN (GLUCOPHAGE) 500 MG tablet Take 500 mg by mouth 2 (two) times daily with a meal.   Yes [provider]  sertraline (ZOLOFT) 50 MG tablet Take by mouth.   Yes [provider]    clotrimazole (LOTRIMIN) 1 % cream Apply to affected area 2 times daily 08/13/20   Shirlee Latch, PA-C  fluconazole (DIFLUCAN) 150 MG tablet 1 tab PO q72h 08/13/20   Eusebio Friendly B, PA-C  meloxicam (MOBIC) 15 MG tablet Take 1 tablet (15 mg total) by mouth daily. 08/12/15   Cuthriell, Delorise Royals, PA-C  metoCLOPramide (REGLAN) 10 MG tablet Take 1 tablet (10 mg total) by mouth every 8 (eight) hours as needed for nausea or vomiting. 02/16/19 02/16/20  Darci Current, MD  metroNIDAZOLE (FLAGYL) 500 MG tablet Take 1 tablet (500 mg total) by mouth 2 (two) times daily for 7 days. 08/13/20 08/20/20  Shirlee Latch, PA-C    Family History Family History  Problem Relation Age of Onset  . Hypertension Mother   . Hypertension Father   . Cancer Father   . Diabetes Father     Social History Social History   Tobacco Use  . Smoking status: Never Smoker  . Smokeless tobacco: Never Used  Vaping Use  . Vaping Use: Every day  Substance Use Topics  . Alcohol use: No  . Drug use: Never     Allergies   Patient has no known allergies.   Review of Systems Review of Systems  Constitutional: Negative for fatigue and fever.  Gastrointestinal: Negative for abdominal pain, nausea and vomiting.  Genitourinary: Positive for vaginal discharge and vaginal pain.  Negative for dysuria, flank pain, frequency, hematuria, urgency and vaginal bleeding.  Musculoskeletal: Negative for back pain.  Skin: Positive for wound. Negative for rash.     Physical Exam Triage Vital Signs ED Triage Vitals  Enc Vitals Group     BP 08/13/20 1300 108/69     Pulse Rate 08/13/20 1300 76     Resp 08/13/20 1300 14     Temp 08/13/20 1300 98.3 F (36.8 C)     Temp Source 08/13/20 1300 Oral     SpO2 08/13/20 1300 99 %     Weight 08/13/20 1256 240 lb (108.9 kg)     Height 08/13/20 1256 5\' 7"  (1.702 m)     Head Circumference --      Peak Flow --      Pain Score 08/13/20 1256 7     Pain Loc --      Pain Edu? --       Excl. in GC? --    No data found.  Updated Vital Signs BP 108/69 (BP Location: Right Arm)   Pulse 76   Temp 98.3 F (36.8 C) (Oral)   Resp 14   Ht 5\' 7"  (1.702 m)   Wt 240 lb (108.9 kg)   LMP 08/11/2020 (Exact Date)   SpO2 99%   BMI 37.59 kg/m       Physical Exam Vitals and nursing note reviewed.  Constitutional:      General: She is not in acute distress.    Appearance: Normal appearance. She is not ill-appearing or toxic-appearing.  HENT:     Head: Normocephalic and atraumatic.  Eyes:     General: No scleral icterus.       Right eye: No discharge.        Left eye: No discharge.     Conjunctiva/sclera: Conjunctivae normal.  Cardiovascular:     Rate and Rhythm: Normal rate and regular rhythm.     Heart sounds: Normal heart sounds.  Pulmonary:     Effort: Pulmonary effort is normal. No respiratory distress.     Breath sounds: Normal breath sounds.  Abdominal:     Palpations: Abdomen is soft.     Tenderness: There is no abdominal tenderness.  Genitourinary:    Exam position: Lithotomy position.     Vagina: Bleeding present.     Comments: A superficial laceration affecting the left superior labia majora.  Area is tender to palpation  There is a hypopigmented and moist rash of perineum Musculoskeletal:     Cervical back: Neck supple.  Skin:    General: Skin is dry.  Neurological:     General: No focal deficit present.     Mental Status: She is alert. Mental status is at baseline.     Motor: No weakness.     Gait: Gait normal.  Psychiatric:        Mood and Affect: Mood normal.        Behavior: Behavior normal.        Thought Content: Thought content normal.      UC Treatments / Results  Labs (all labs ordered are listed, but only abnormal results are displayed) Labs Reviewed  WET PREP, GENITAL - Abnormal; Notable for the following components:      Result Value   Yeast Wet Prep HPF POC PRESENT (*)    Clue Cells Wet Prep HPF POC PRESENT (*)    All other  components within normal limits    EKG   Radiology No  results found.  Procedures Procedures (including critical care time)  Medications Ordered in UC Medications - No data to display  Initial Impression / Assessment and Plan / UC Course  I have reviewed the triage vital signs and the nursing notes.  Pertinent labs & imaging results that were available during my care of the patient were reviewed by me and considered in my medical decision making (see chart for details).   On my exam, patient does have radiation of labia.  I expect this is secondary to scratching due to symptoms related to vaginal yeast infection and bacterial vaginosis.  Vaginal swab does confirm presence of clue cells and yeast.  Treating patient at this time with metronidazole as well as Diflucan and clotrimazole topical.  Advised her to keep better control of her blood sugar has this is a contributing factor to yeast infections.  Advised her to follow-up with endocrinologist about her diabetes.  If she has recurrent yeast infections and vaginal complaints, advised her to follow-up with her gynecologist.  Patient knows she can return to our clinic as needed for any concerns.  Final Clinical Impressions(s) / UC Diagnoses   Final diagnoses:  Acute vaginitis  Yeast infection of the skin  Non-obstetric vaginal laceration without foreign body or perineal laceration, initial encounter     Discharge Instructions     Your vaginal swab shows that you have bacterial vaginosis as well as a vaginal yeast infection.  Take Diflucan 1 tablet every 72 hours for up to 3 doses.  If you continue to have yeast infection symptoms after that refill the prescription and take 1 tablet/week.  Take the metronidazole for bacterial vaginosis.  Since you have had a yeast infection for a long amount of time, you may want to follow-up with your PCP or gynecologist about this.  Sometimes, people need to be on the Diflucan weekly for a couple of  months.  Make sure you are controlling your blood sugar well because elevated blood sugars can put you at increased risk for recurrent yeast infections.  Apply the clotrimazole topically to the areas that look like they are cut and raw.  Apply twice a day.  Make sure you are keeping yourself clean and dry.  Follow-up with our department as needed for any new or worsening symptoms.    ED Prescriptions    Medication Sig Dispense Auth. Provider   fluconazole (DIFLUCAN) 150 MG tablet 1 tab PO q72h 3 tablet Eusebio Friendly B, PA-C   metroNIDAZOLE (FLAGYL) 500 MG tablet Take 1 tablet (500 mg total) by mouth 2 (two) times daily for 7 days. 14 tablet Eusebio Friendly B, PA-C   clotrimazole (LOTRIMIN) 1 % cream Apply to affected area 2 times daily 28 g Shirlee Latch, [place]     PDMP not reviewed this encounter.   Shirlee Latch, PA-C 08/14/20 1910

## 2020-08-13 NOTE — Discharge Instructions (Signed)
Your vaginal swab shows that you have bacterial vaginosis as well as a vaginal yeast infection.  Take Diflucan 1 tablet every 72 hours for up to 3 doses.  If you continue to have yeast infection symptoms after that refill the prescription and take 1 tablet/week.  Take the metronidazole for bacterial vaginosis.  Since you have had a yeast infection for a long amount of time, you may want to follow-up with your PCP or gynecologist about this.  Sometimes, people need to be on the Diflucan weekly for a couple of months.  Make sure you are controlling your blood sugar well because elevated blood sugars can put you at increased risk for recurrent yeast infections.  Apply the clotrimazole topically to the areas that look like they are cut and raw.  Apply twice a day.  Make sure you are keeping yourself clean and dry.  Follow-up with our department as needed for any new or worsening symptoms.

## 2020-08-13 NOTE — ED Triage Notes (Signed)
Patient states that she was treated for yeast infection and her 2nd pill for Diflucan was on Friday.  Patient states that she is now having vaginal pain.  Patient states that she is currently on her menstrual cycle.

## 2021-02-07 ENCOUNTER — Other Ambulatory Visit: Payer: Self-pay

## 2021-02-07 MED ORDER — MEDROXYPROGESTERONE ACETATE 150 MG/ML IM SUSY
PREFILLED_SYRINGE | INTRAMUSCULAR | 3 refills | Status: DC
  Filled 2021-02-07: qty 1, 28d supply, fill #0

## 2022-03-01 ENCOUNTER — Other Ambulatory Visit (HOSPITAL_COMMUNITY): Payer: Self-pay | Admitting: Family Medicine

## 2022-03-01 ENCOUNTER — Other Ambulatory Visit: Payer: Self-pay | Admitting: Family Medicine

## 2022-03-01 DIAGNOSIS — Z3481 Encounter for supervision of other normal pregnancy, first trimester: Secondary | ICD-10-CM

## 2022-03-14 ENCOUNTER — Ambulatory Visit
Admission: RE | Admit: 2022-03-14 | Discharge: 2022-03-14 | Disposition: A | Discharge status: Home / Self Care | Payer: Managed Care, Other (non HMO) | Source: Ambulatory Visit | Attending: Family Medicine | Admitting: Family Medicine

## 2022-03-14 DIAGNOSIS — Z3A08 8 weeks gestation of pregnancy: Secondary | ICD-10-CM | POA: Insufficient documentation

## 2022-03-14 DIAGNOSIS — Z3481 Encounter for supervision of other normal pregnancy, first trimester: Secondary | ICD-10-CM

## 2022-03-14 DIAGNOSIS — Z3689 Encounter for other specified antenatal screening: Secondary | ICD-10-CM | POA: Insufficient documentation

## 2022-04-18 ENCOUNTER — Other Ambulatory Visit: Payer: Self-pay | Admitting: Obstetrics

## 2022-04-18 DIAGNOSIS — Z3689 Encounter for other specified antenatal screening: Secondary | ICD-10-CM

## 2022-04-23 ENCOUNTER — Ambulatory Visit: Payer: Managed Care, Other (non HMO)

## 2022-05-23 ENCOUNTER — Ambulatory Visit: Payer: Managed Care, Other (non HMO) | Admitting: Maternal & Fetal Medicine

## 2022-05-23 ENCOUNTER — Ambulatory Visit: Payer: Managed Care, Other (non HMO) | Attending: Maternal & Fetal Medicine

## 2022-05-23 ENCOUNTER — Other Ambulatory Visit: Payer: Self-pay

## 2022-05-23 ENCOUNTER — Ambulatory Visit: Payer: Managed Care, Other (non HMO) | Admitting: *Deleted

## 2022-05-23 DIAGNOSIS — O99212 Obesity complicating pregnancy, second trimester: Secondary | ICD-10-CM

## 2022-05-23 DIAGNOSIS — O24112 Pre-existing diabetes mellitus, type 2, in pregnancy, second trimester: Secondary | ICD-10-CM | POA: Diagnosis not present

## 2022-05-23 DIAGNOSIS — E119 Type 2 diabetes mellitus without complications: Secondary | ICD-10-CM | POA: Diagnosis not present

## 2022-05-23 DIAGNOSIS — Z3A18 18 weeks gestation of pregnancy: Secondary | ICD-10-CM

## 2022-05-23 DIAGNOSIS — E669 Obesity, unspecified: Secondary | ICD-10-CM

## 2022-05-23 DIAGNOSIS — O24319 Unspecified pre-existing diabetes mellitus in pregnancy, unspecified trimester: Secondary | ICD-10-CM

## 2022-05-23 DIAGNOSIS — Z3689 Encounter for other specified antenatal screening: Secondary | ICD-10-CM

## 2022-05-23 NOTE — Progress Notes (Signed)
MFM Consultation  Ellen Jensen is a 31 yo G3P2 how is seen at 55 w 4 d by a [redacted]w[redacted]d ultrasound giving her and EDD of 10/20/21.  She is seen at the request of Chari Manning, CNM regarding a history of T2DM and a BMI of >40.  Today she is doing well without complaints.  She has a LF NIPS, neg AFP normal baseline preeclampsia labs.   She has a HbA1c of 7.9% making her average BS 180 mgdL.  She reports that her FBS have been within normal range however, her 2hr PP have been elevated in the 160's. She is taking Morning: NPH 50 units; Regular 28 units Lunch: Regular 20 units Evening: NPH 28 units; Regular 28 units  With > 50% of her blood sugars elevated. She is not taking daily low dose ASA for preeclampsia prevention.  She has a fetal echocardiogram scheduled per her report.     05/23/2022    7:55 AM 08/13/2020    1:00 PM 08/13/2020   12:56 PM  Vitals with BMI  Height 5\' 7"   5\' 7"   Weight 267 lbs  240 lbs  BMI 41.81  37.58  Systolic 122 108   Diastolic 78 69   Pulse 72 76       Latest Ref Rng & Units 02/15/2019   11:50 PM 06/15/2012   11:53 PM  CBC  WBC 4.0 - 10.5 K/uL 7.6  10.5   Hemoglobin 12.0 - 15.0 g/dL 04/17/2019  08/15/2012   Hematocrit 36.0 - 46.0 % 43.8  38.6   Platelets 150 - 400 K/uL 296  306       Latest Ref Rng & Units 02/15/2019   11:50 PM 06/15/2012   11:53 PM  CMP  Glucose 70 - 99 mg/dL 04/17/2019  08/15/2012   BUN 6 - 20 mg/dL 6  10   Creatinine 045 - 1.00 mg/dL 409  8.11   Sodium 9.14 - 145 mmol/L 134  135   Potassium 3.5 - 5.1 mmol/L 3.4  3.5   Chloride 98 - 111 mmol/L 101  104   CO2 22 - 32 mmol/L 20  23   Calcium 8.9 - 10.3 mg/dL 8.6  8.5   Total Protein 6.5 - 8.1 g/dL 7.9  7.9   Total Bilirubin 0.3 - 1.2 mg/dL 0.5  0.2   Alkaline Phos 38 - 126 U/L 64  95   AST 15 - 41 U/L 29  31   ALT 0 - 44 U/L 30  41    OB History  Gravida Para Term Preterm AB Living  3 2 2  0 0 2  SAB IAB Ectopic Multiple Live Births  0 0 0 0 2    # Outcome Date GA Lbr Len/2nd Weight Sex Delivery  Anes PTL Lv  3 Current           2 Term 02/14/17    F Vag-Spont   LIV  1 Term 03/25/13    F Vag-Spont   LIV   Past Medical History:  Diagnosis Date   Diabetes mellitus without complication (HCC)    Past Surgical History:  Procedure Laterality Date   CHOLECYSTECTOMY, LAPAROSCOPIC     WISDOM TOOTH EXTRACTION     Family History  Problem Relation Age of Onset   Diabetes Mother    Hypertension Father    Cancer Father    Diabetes Father    Diabetes Maternal Grandmother    Prostate cancer Paternal Grandfather    Social History  Socioeconomic History   Marital status: Married    Spouse name: Kennedy Bucker   Number of children: Not on file   Years of education: Not on file   Highest education level: Not on file  Occupational History   Occupation: Web designer    Comment: [employer]  Tobacco Use   Smoking status: Never   Smokeless tobacco: Never  Vaping Use   Vaping Use: Former  Substance and Sexual Activity   Alcohol use: No   Drug use: Never   Sexual activity: Yes  Other Topics Concern   Not on file  Social History Narrative   Not on file   Social Determinants of Health   Financial Resource Strain: Not on file  Food Insecurity: Not on file  Transportation Needs: Not on file  Physical Activity: Not on file  Stress: Not on file  Social Connections: Not on file  Intimate Partner Violence: Not on file   Allergies  Allergen Reactions   Sulfa Antibiotics Nausea And Vomiting    Current Outpatient Medications (Endocrine & Metabolic):    insulin NPH-regular Human (70-30) 100 UNIT/ML injection, Inject 78 Units into the skin daily with breakfast. 50 AM 28 PM as of 05/23/22   insulin regular (NOVOLIN R) 100 units/mL injection, Inject 76 Units into the skin 3 (three) times daily before meals. 28 AM 20 Lunch 28 PM- as of 05/23/22      Current Outpatient Medications (Other):    metoCLOPramide (REGLAN) 10 MG tablet, Take 1 tablet (10 mg total) by mouth every 8 (eight)  hours as needed for nausea or vomiting.   Prenatal Vit-Fe Fumarate-FA (PRENATAL MULTIVITAMIN) TABS tablet, Take 1 tablet by mouth daily at 12 noon.   sertraline (ZOLOFT) 50 MG tablet, Take by mouth.   Imaging: Single intrauterine pregnancy here for a detailed anatomy due to type 2 diabetes Normal anatomy with measurements consistent with dates There is good fetal movement and amniotic fluid volume Suboptimal views of the fetal anatomy were obtained secondary to fetal position.   Impression/Counseling:  I reviewed today's study and recommend serial growth, fetal echocardiogram and initiation of weekly testing at 32 weeks. We discussed the increased risk for fetal macrosomia, cardiac defects, maternal and fetal birth trauma with temporary and/or permenant damage, stillbirth and neonatal ICU admission.   We discussed the goal of FBS < 95 mg/dL and 2hr pp < 144 mg/dL.   We recommend increasing her medical therapy if > 25% of her values are abnormal.   We reviewed her nutrition and exercise habits. I discovered that her fruit intake needs modification. In addition, she notes that she has had hypoglycemic episodes I canceled her on her snack intake.   Finally we discussed TWG of 11-20 lbs during the pregnancy. We reviewed risk associated the elevated which are similar to those of T2DM in addition to an increased risk for cesarean delivery and thrombosis in pregnancy.   We discussed starting daily low dose ASA for the prevention of preeclampsia.   All questions answered.  We have scheduled her to return in 4 weeks to complete the fetal anatomy.   I spent 45 minutes with > 50% in face to face consultation.  Novella Olive, MD

## 2022-06-17 IMAGING — US US OB < 14 WEEKS - US OB TV
1 series · 14 of 28 positions shown · non-contrast
Comparison: None Available.

CLINICAL DATA: Viability

EXAM:
OBSTETRIC <14 WK US AND TRANSVAGINAL OB US
TECHNIQUE: Both transabdominal and transvaginal ultrasound examinations were
performed for complete evaluation of the gestation as well as the
maternal uterus, adnexal regions, and pelvic cul-de-sac.
Transvaginal technique was performed to assess early pregnancy.

[Series 1: us ob less than 14 weeks with ob transvaginal · 14 of 128 slices shown]
[im 5/128]
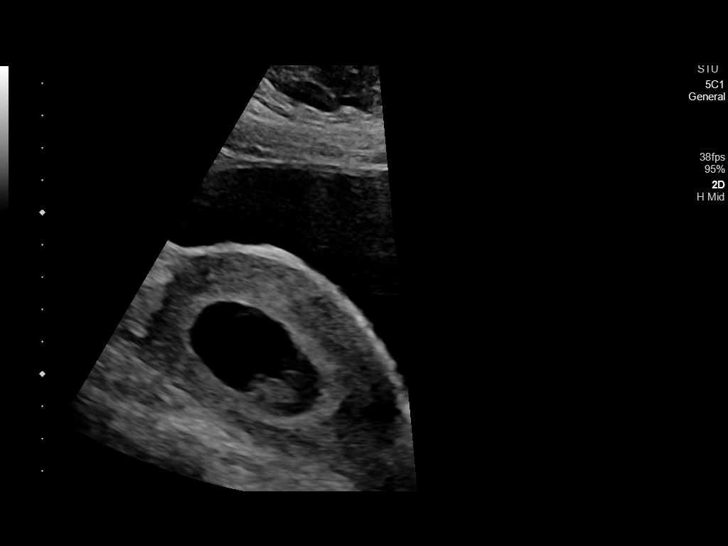
[im 15/128]
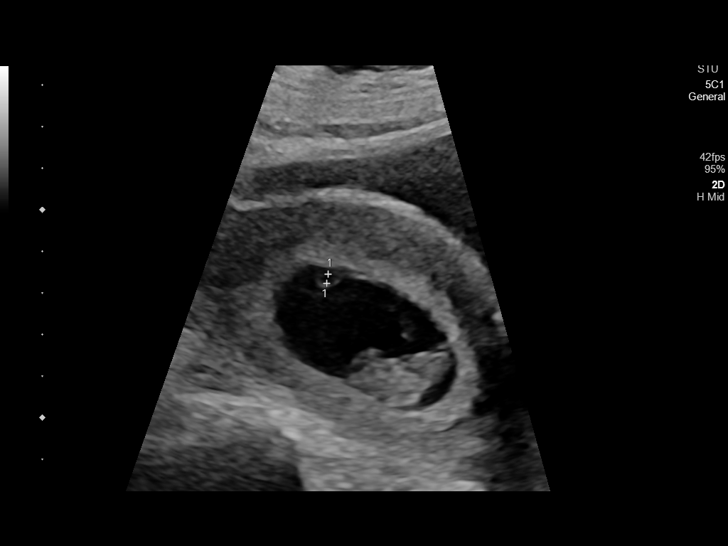
[im 24/128]
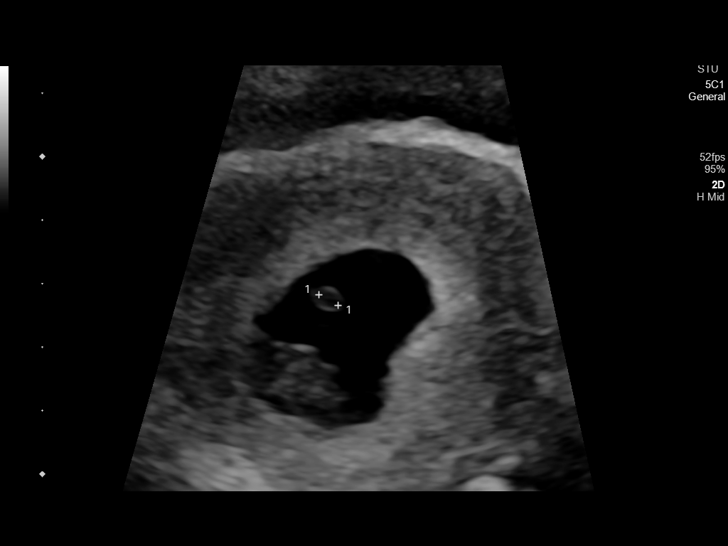
[im 33/128]
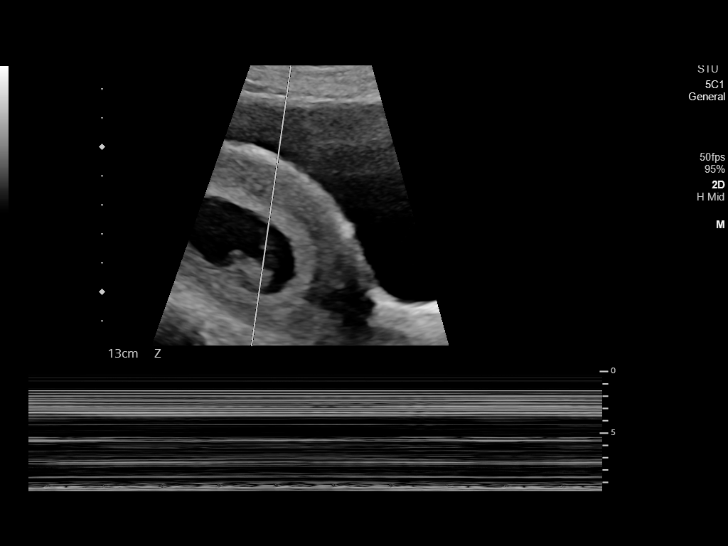
[im 43/128]
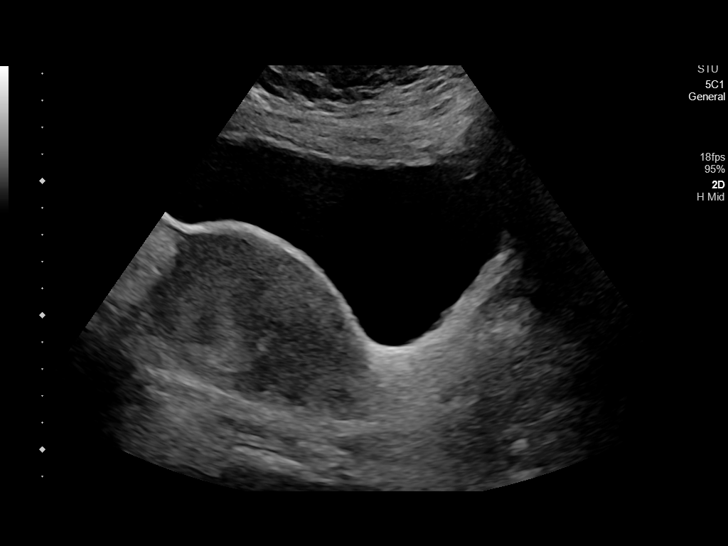
[im 52/128]
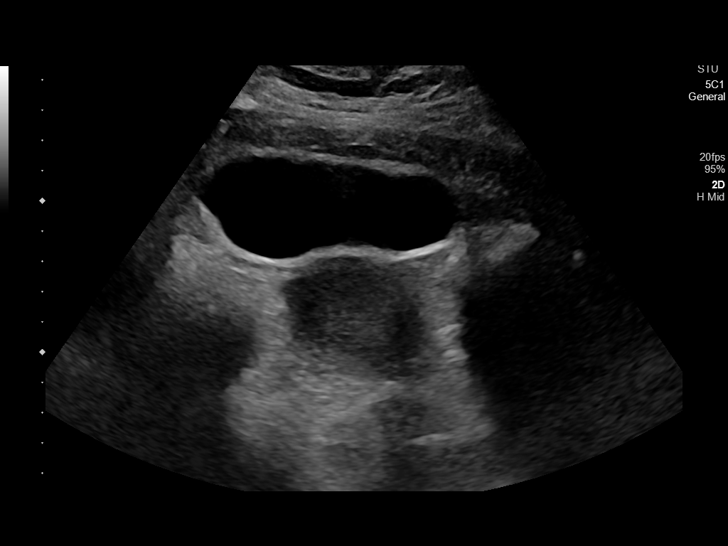
[im 62/128]
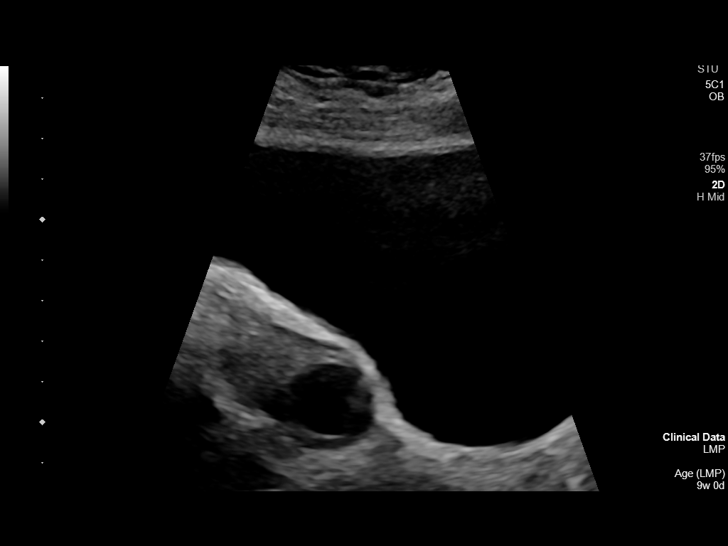
[im 71/128]
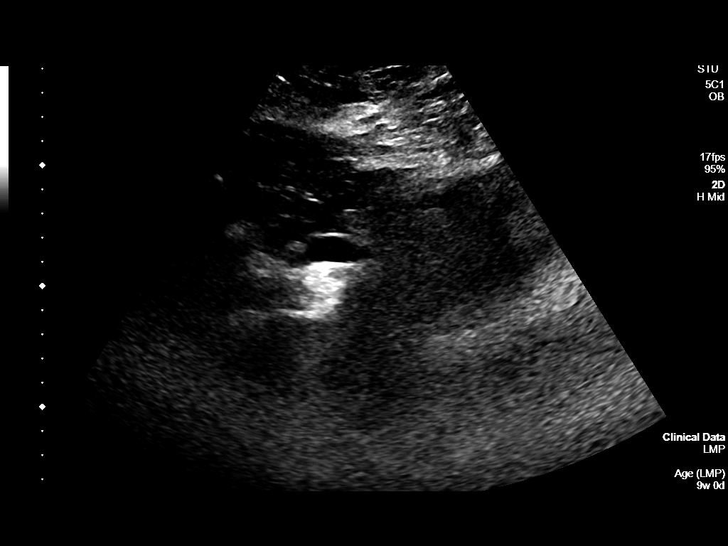
[im 80/128]
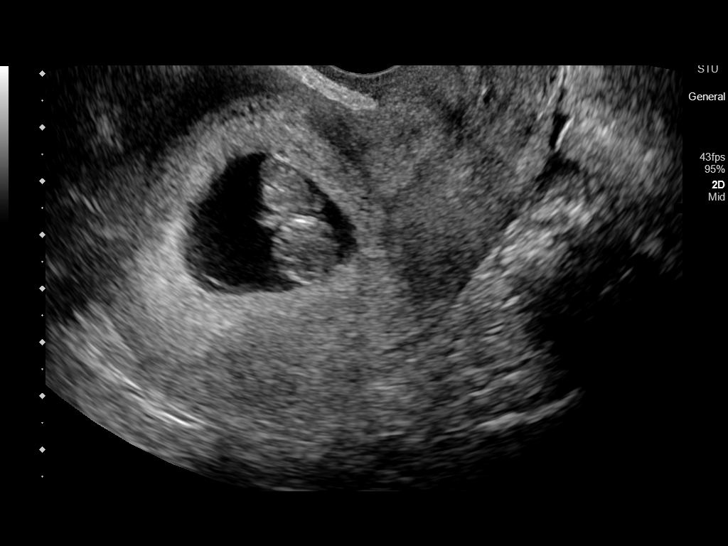
[im 90/128]
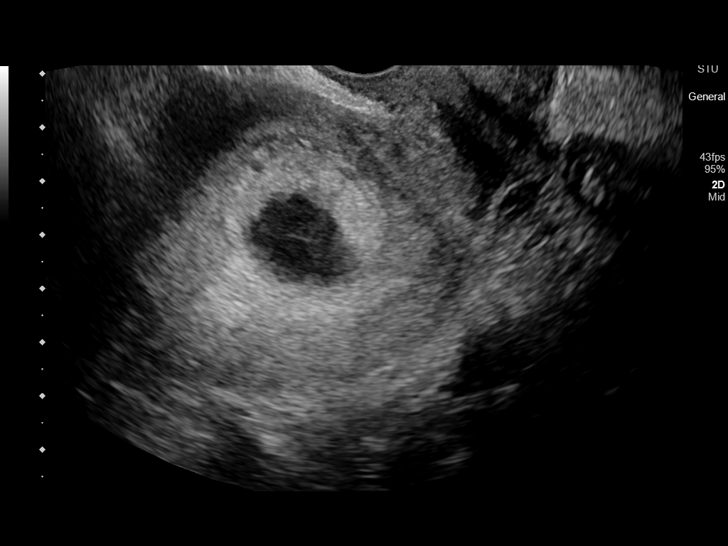
[im 99/128]
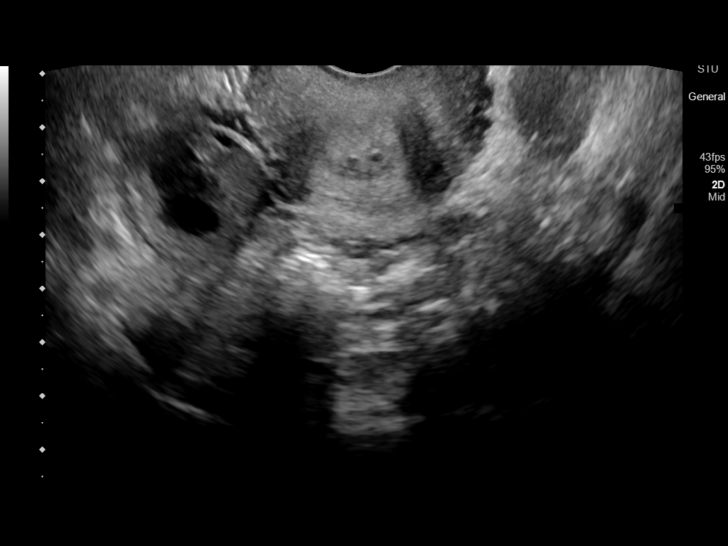
[im 109/128]
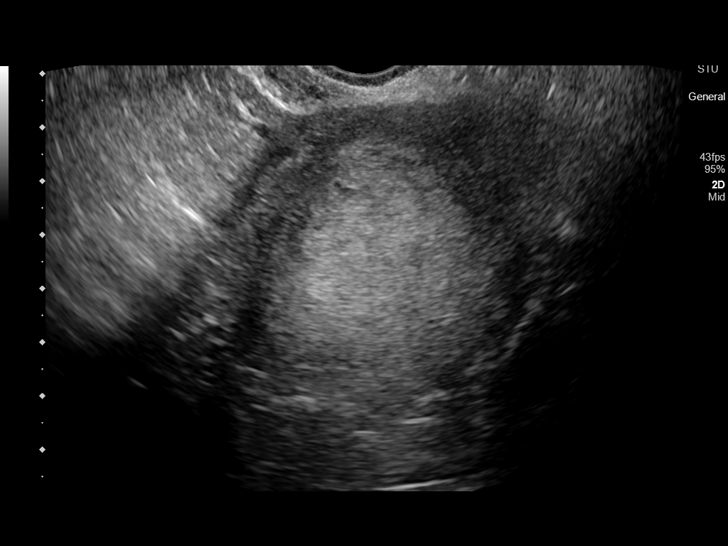
[im 118/128]
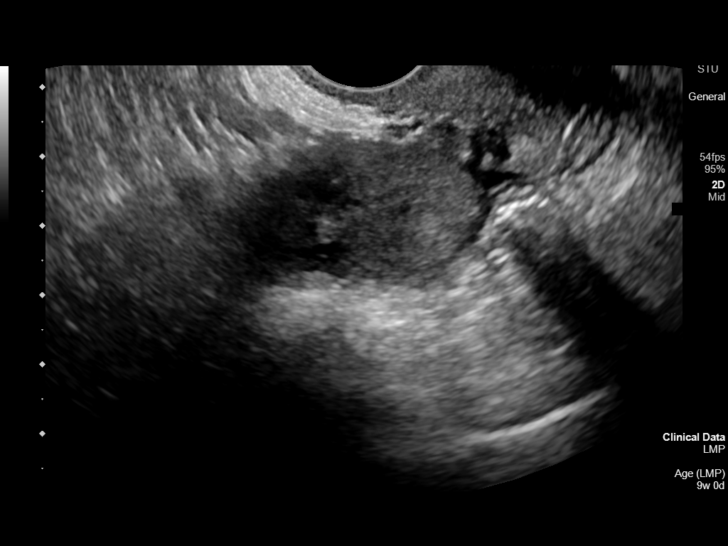
[im 128/128]
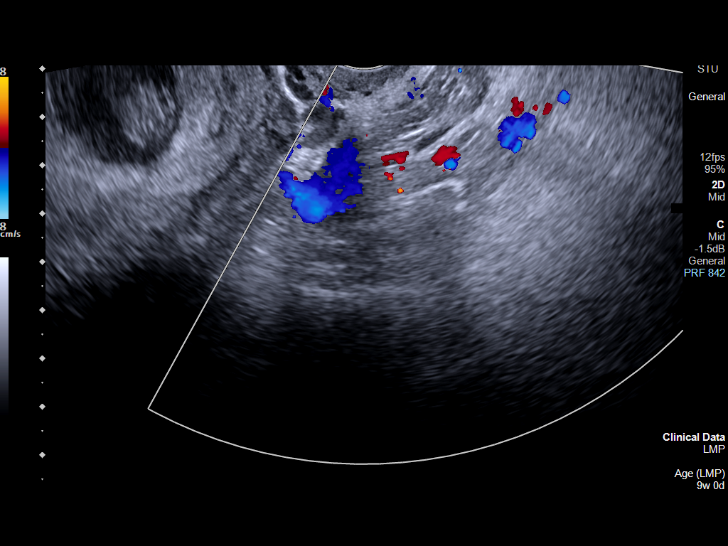

[14 of 28 positions shown; findings below may reference images not displayed]

FINDINGS: Intrauterine gestational sac: Single

Yolk sac:  Visualized.

Embryo:  Visualized.

Cardiac Activity: Visualized.

Heart Rate: 176 bpm

CRL:  20  mm   8 w   4 d                  US EDC: 10/20/2022

Subchorionic hemorrhage:  None visualized.

Maternal uterus/adnexae: Normal appearing right ovary with a corpus
luteum cyst measuring 2.6 x 2.2 x 2.5 cm. Left ovary is not
visualized.
IMPRESSION: Single viable intrauterine pregnancy with an estimated gestational
age of 8 weeks 4 days and heart rate of 176 beats per minute.

## 2022-06-18 ENCOUNTER — Other Ambulatory Visit: Payer: Self-pay

## 2022-06-18 DIAGNOSIS — O0990 Supervision of high risk pregnancy, unspecified, unspecified trimester: Secondary | ICD-10-CM

## 2022-06-18 DIAGNOSIS — O24312 Unspecified pre-existing diabetes mellitus in pregnancy, second trimester: Secondary | ICD-10-CM

## 2022-06-20 ENCOUNTER — Other Ambulatory Visit: Payer: Self-pay

## 2022-06-20 ENCOUNTER — Ambulatory Visit: Payer: Managed Care, Other (non HMO) | Attending: Obstetrics

## 2022-06-20 DIAGNOSIS — Z3A22 22 weeks gestation of pregnancy: Secondary | ICD-10-CM | POA: Diagnosis not present

## 2022-06-20 DIAGNOSIS — O24112 Pre-existing diabetes mellitus, type 2, in pregnancy, second trimester: Secondary | ICD-10-CM | POA: Diagnosis not present

## 2022-06-20 DIAGNOSIS — Z794 Long term (current) use of insulin: Secondary | ICD-10-CM | POA: Diagnosis not present

## 2022-06-20 DIAGNOSIS — O24312 Unspecified pre-existing diabetes mellitus in pregnancy, second trimester: Secondary | ICD-10-CM

## 2022-06-20 DIAGNOSIS — Z7984 Long term (current) use of oral hypoglycemic drugs: Secondary | ICD-10-CM | POA: Diagnosis not present

## 2022-06-20 DIAGNOSIS — O99212 Obesity complicating pregnancy, second trimester: Secondary | ICD-10-CM

## 2022-06-20 DIAGNOSIS — O0990 Supervision of high risk pregnancy, unspecified, unspecified trimester: Secondary | ICD-10-CM

## 2022-07-16 ENCOUNTER — Other Ambulatory Visit: Payer: Self-pay

## 2022-07-16 DIAGNOSIS — O99210 Obesity complicating pregnancy, unspecified trimester: Secondary | ICD-10-CM

## 2022-07-16 DIAGNOSIS — O24312 Unspecified pre-existing diabetes mellitus in pregnancy, second trimester: Secondary | ICD-10-CM

## 2022-07-18 ENCOUNTER — Other Ambulatory Visit: Payer: Self-pay

## 2022-07-18 ENCOUNTER — Ambulatory Visit: Payer: Managed Care, Other (non HMO) | Attending: Obstetrics

## 2022-07-18 DIAGNOSIS — O24312 Unspecified pre-existing diabetes mellitus in pregnancy, second trimester: Secondary | ICD-10-CM

## 2022-07-18 DIAGNOSIS — O24112 Pre-existing diabetes mellitus, type 2, in pregnancy, second trimester: Secondary | ICD-10-CM | POA: Diagnosis present

## 2022-07-18 DIAGNOSIS — Z362 Encounter for other antenatal screening follow-up: Secondary | ICD-10-CM | POA: Insufficient documentation

## 2022-07-18 DIAGNOSIS — O99210 Obesity complicating pregnancy, unspecified trimester: Secondary | ICD-10-CM

## 2022-07-18 DIAGNOSIS — Z3A26 26 weeks gestation of pregnancy: Secondary | ICD-10-CM | POA: Diagnosis not present

## 2022-07-18 DIAGNOSIS — O99212 Obesity complicating pregnancy, second trimester: Secondary | ICD-10-CM | POA: Diagnosis not present

## 2022-08-29 ENCOUNTER — Inpatient Hospital Stay: Admission: RE | Admit: 2022-08-29 | Payer: Managed Care, Other (non HMO) | Source: Ambulatory Visit

## 2022-09-10 ENCOUNTER — Other Ambulatory Visit: Payer: Managed Care, Other (non HMO)

## 2023-04-29 ENCOUNTER — Other Ambulatory Visit: Payer: Self-pay | Admitting: Family Medicine

## 2023-04-29 DIAGNOSIS — N611 Abscess of the breast and nipple: Secondary | ICD-10-CM

## 2023-05-08 ENCOUNTER — Ambulatory Visit
Admission: RE | Admit: 2023-05-08 | Discharge: 2023-05-08 | Disposition: A | Discharge status: Home / Self Care | Payer: Managed Care, Other (non HMO) | Source: Ambulatory Visit | Attending: Family Medicine | Admitting: Family Medicine

## 2023-05-08 ENCOUNTER — Encounter: Payer: Self-pay | Admitting: Radiology

## 2023-05-08 DIAGNOSIS — N611 Abscess of the breast and nipple: Secondary | ICD-10-CM | POA: Insufficient documentation
